# Patient Record
Sex: Female | Born: 1975 | Race: White | Hispanic: No | Marital: Married | Smoking: Never smoker
Health system: Southern US, Community
[De-identification: ages and names within clinical notes are randomized; demographics above are authoritative.]

## PROBLEM LIST (undated history)

## (undated) DIAGNOSIS — T4145XA Adverse effect of unspecified anesthetic, initial encounter: Secondary | ICD-10-CM

## (undated) DIAGNOSIS — T8859XA Other complications of anesthesia, initial encounter: Secondary | ICD-10-CM

## (undated) DIAGNOSIS — R569 Unspecified convulsions: Secondary | ICD-10-CM

## (undated) DIAGNOSIS — K219 Gastro-esophageal reflux disease without esophagitis: Secondary | ICD-10-CM

## (undated) HISTORY — PX: BREAST CYST ASPIRATION: SHX578

---

## 2000-04-05 ENCOUNTER — Other Ambulatory Visit: Admission: RE | Admit: 2000-04-05 | Discharge: 2000-04-05 | Payer: Self-pay | Admitting: Gynecology

## 2000-10-03 ENCOUNTER — Other Ambulatory Visit: Admission: RE | Admit: 2000-10-03 | Discharge: 2000-10-03 | Payer: Self-pay | Admitting: *Deleted

## 2001-09-07 ENCOUNTER — Other Ambulatory Visit: Admission: RE | Admit: 2001-09-07 | Discharge: 2001-09-07 | Payer: Self-pay | Admitting: *Deleted

## 2002-10-02 ENCOUNTER — Other Ambulatory Visit: Admission: RE | Admit: 2002-10-02 | Discharge: 2002-10-02 | Payer: Self-pay | Admitting: Obstetrics and Gynecology

## 2003-11-05 ENCOUNTER — Other Ambulatory Visit: Admission: RE | Admit: 2003-11-05 | Discharge: 2003-11-05 | Payer: Self-pay | Admitting: Obstetrics and Gynecology

## 2004-04-20 ENCOUNTER — Other Ambulatory Visit: Admission: RE | Admit: 2004-04-20 | Discharge: 2004-04-20 | Payer: Self-pay | Admitting: Gynecology

## 2004-06-19 ENCOUNTER — Encounter (INDEPENDENT_AMBULATORY_CARE_PROVIDER_SITE_OTHER): Payer: Self-pay | Admitting: Specialist

## 2004-06-19 ENCOUNTER — Ambulatory Visit (HOSPITAL_COMMUNITY): Admission: RE | Admit: 2004-06-19 | Discharge: 2004-06-19 | Payer: Self-pay | Admitting: Plastic Surgery

## 2004-06-19 ENCOUNTER — Ambulatory Visit (HOSPITAL_BASED_OUTPATIENT_CLINIC_OR_DEPARTMENT_OTHER): Admission: RE | Admit: 2004-06-19 | Discharge: 2004-06-19 | Payer: Self-pay | Admitting: Plastic Surgery

## 2004-11-14 ENCOUNTER — Inpatient Hospital Stay (HOSPITAL_COMMUNITY): Admission: AD | Admit: 2004-11-14 | Discharge: 2004-11-17 | Payer: Self-pay | Admitting: Gynecology

## 2004-11-14 ENCOUNTER — Encounter (INDEPENDENT_AMBULATORY_CARE_PROVIDER_SITE_OTHER): Payer: Self-pay | Admitting: *Deleted

## 2004-12-25 ENCOUNTER — Other Ambulatory Visit: Admission: RE | Admit: 2004-12-25 | Discharge: 2004-12-25 | Payer: Self-pay | Admitting: Gynecology

## 2006-01-18 ENCOUNTER — Other Ambulatory Visit: Admission: RE | Admit: 2006-01-18 | Discharge: 2006-01-18 | Payer: Self-pay | Admitting: Gynecology

## 2007-01-20 ENCOUNTER — Other Ambulatory Visit: Admission: RE | Admit: 2007-01-20 | Discharge: 2007-01-20 | Payer: Self-pay | Admitting: Gynecology

## 2008-01-25 ENCOUNTER — Encounter: Payer: Self-pay | Admitting: Women's Health

## 2008-01-25 ENCOUNTER — Ambulatory Visit: Payer: Self-pay | Admitting: Women's Health

## 2008-01-25 ENCOUNTER — Other Ambulatory Visit: Admission: RE | Admit: 2008-01-25 | Discharge: 2008-01-25 | Payer: Self-pay | Admitting: Gynecology

## 2008-08-12 ENCOUNTER — Ambulatory Visit: Payer: Self-pay | Admitting: Obstetrics and Gynecology

## 2008-08-14 ENCOUNTER — Encounter: Admission: RE | Admit: 2008-08-14 | Discharge: 2008-08-14 | Payer: Self-pay | Admitting: Obstetrics and Gynecology

## 2009-02-14 ENCOUNTER — Encounter: Admission: RE | Admit: 2009-02-14 | Discharge: 2009-02-14 | Payer: Self-pay | Admitting: Obstetrics & Gynecology

## 2010-05-22 NOTE — Op Note (Signed)
NAMECHERYAL, SALAS NO.:  [id number]   MEDICAL RECORD NO.:  [id number]          PATIENT TYPE:  AMB   LOCATION:  DSC                          FACILITY:  [facility]   PHYSICIAN:  Alfredia Ferguson, M.D.  DATE OF BIRTH:  July 11, 1975   DATE OF PROCEDURE:  06/19/2004  DATE OF DISCHARGE:                                 OPERATIVE REPORT   PREOPERATIVE DIAGNOSES:  1.  Biopsy-proven dysplastic nevus, left posterior flank, the 8-mm biopsy      site.  2.  Biopsy-proven dysplastic nevus, right scapular area, adjacent to a scar      with biopsy site approximately 9 mm in diameter.   POSTOPERATIVE DIAGNOSES:  1.  Biopsy-proven dysplastic nevus, left posterior flank, the 8-mm biopsy      site.  2.  Biopsy-proven dysplastic nevus, right scapular area, adjacent to a scar      with biopsy site approximately 9 mm in diameter.   OPERATION:  Elliptical excision of dysplastic nevi x2 with approximately 3-  mm margins.   SURGEON:  Alfredia Ferguson, M.D.   ANESTHESIA:  2% Xylocaine and 1:100,000 epinephrine, a total of 3.6 mL used  for both lesions.   INDICATIONS FOR SURGERY:  This a 35 year old woman who is approximately 4  months' pregnant who has biopsy-proven dysplastic nevi in her back.  She  wishes to have these removed in order to clear the margins.  The risk of the  surgery including unsightly scarring and positive margins were discussed  with the patient.  She spoke with her obstetrician regarding the use of  local anesthesia.  She was told that either lidocaine plain or lidocaine  with epinephrine was fine.  I reduced the volume to as small a volume as I  could for excision.   DESCRIPTION OF SURGERY:  Skin marks were placed in an elliptical fashion  around each of the 2 lesions with approximately 3-mm margins.  Local  anesthesia was infiltrated and the areas were prepped with Betadine and  draped with sterile drapes.  After waiting 10 minutes, attention was  directed to the  left posterior flank lesion which was excised in an  elliptical fashion down to the level of the subcutaneous tissue.  Specimen  was submitted for pathology.  Wound edges were undermined for a distance of  several millimeters in all directions.  The wound was closed with  interrupted 4-0 Monocryl sutures for the dermis followed by Steri-Strips for  the skin edges.  Attention was directed to the right scapular area, where an  identical procedure was performed.  Upon completion of the  closure of that site, the areas were cleansed and dried, and light dressings  were applied.  The patient tolerated the procedure well.  She had minimal  blood loss.  Light dressings were applied and the patient was discharged to  home in satisfactory addition.       WBB/MEDQ  D:  06/19/2004  T:  06/19/2004  Job:  [id number]   cc:   Hope M. Danella Deis, M.D.  [address]  [place] [address]  Fax: [phone]

## 2010-05-22 NOTE — H&P (Signed)
NAME:  [name], [name] NO.:  [id number]   MEDICAL RECORD NO.:  [id number]          PATIENT TYPE:  MAT   LOCATION:  MATC                          [facility]ILITY:  [facility]   PHYSICIAN:  Juan H. Lily Peer, M.D.DATE OF BIRTH:  May 26, 1975   DATE OF ADMISSION:  11/14/2004  DATE OF DISCHARGE:                                HISTORY & PHYSICAL   CHIEF COMPLAINT:  1.  Term intrauterine pregnancy at 40-1/[redacted] week gestation.  2.  Active labor.  3.  Advanced cervical dilatation.   HISTORY:  The patient is a 35 year old gravida 2, para 0, AB 1 with  estimated date of confinement of November 13, 2004. The patient last evening  was complaining of irregular contractions and presented to the emergency  room this morning complaining of irregular contractions. Her cervix went  from 4 to 6 cm ____________ maternity admission with a reassuring fetal  heart tracing, contractions every 2 to 3 minutes apart. Vital signs were  stable. She was afebrile. Prenatal course is essentially benign with the  exception that she was followed by a dermatologist for some dysplastic moles  that were removed from her back and her lower extremity.   PAST MEDICAL HISTORY:  The patient with one spontaneous AB in 2005. She has  lactose intolerance, dysplastic mole on her back and lower extremity.   REVIEW OF SYSTEMS:  Please see [place] form.   PHYSICAL EXAMINATION:  VITAL SIGNS:  Stable, afebrile.  HEENT:  Unremarkable.  NECK:  Supple. Trachea midline. No carotid bruits. No thyromegaly.  LUNGS:  Clear to auscultation without rhonchi or wheezes.  HEART:  Regular rate and rhythm. No murmurs or gallops.  BREASTS:  Not done.  ABDOMEN:  Gravid uterus. Vertex presentation by [facility] maneuver.  PELVIC:  6 to 7 cm, bulging membranes, -3 station.  EXTREMITIES:  DTRs 1+. Negative clonus.   PRENATAL LABORATORY DATA:  O positive blood type. Negative antibiotic  screen. VDRL was nonreactive. Rubella immune. Hepatitis B  surface antigen  and HIV were negative. Alpha fetoprotein was normal. Diabetes screen was  normal. The patient was GBS on culture was negative.   ASSESSMENT:  A 35 year old gravida 2, para 0, AB 1 at 40-1/[redacted] weeks gestation  with a planned cervical dilatation in active labor. Cervix 6 to 7 cm,  bulging membranes, -2 station, reassuring fetal heart rate tracing,  contraction every 2 to 3 minutes apart. Vital signs stable, afebrile. Group  B strep culture negative.   PLAN:  Admit to labor and delivery. Patient requesting epidural. Once  epidural is on board, we will proceed with rupture of membranes and augment  labor with Pitocin if indicated.      Juan H. Lily Peer, M.D.  Electronically Signed     JHF/MEDQ  D:  11/14/2004  T:  11/14/2004  Job:  [id number]

## 2010-05-22 NOTE — Op Note (Signed)
[name], [name]               ACCOUNT NO.:  [id number]   MEDICAL RECORD NO.:  [id number]          PATIENT TYPE:  INP   LOCATION:  9130                          [facility]ACILITY:  [facility]   PHYSICIAN:  Juan H. Lily Peer, M.D.DATE OF BIRTH:  09-Dec-1975   DATE OF PROCEDURE:  DATE OF DISCHARGE:                                 OPERATIVE REPORT   SURGEON:  Juan H. Lily Peer, M.D.   INDICATIONS:  A 35 year old, gravida 1, para 0, term with labor, advanced  cervical dilatation.  Underwent artificial rupture of membranes with  meconium amniotic fluid and was noted to be in the frank breech  presentation, confirmed by beside ultrasound.  Reassuring fetal heart rate  tracing.  Spontaneous contractions.   PREOPERATIVE DIAGNOSES:  1.  Term intrauterine pregnancy.  2.  Active labor.  3.  Advanced cervical dilatation.  4.  Meconium stained amniotic fluid.  5.  [name] breech presentation.   POSTOPERATIVE DIAGNOSES:  1.  Term intrauterine pregnancy.  2.  Active labor.  3.  Advanced cervical dilatation.  4.  Meconium stained amniotic fluid.  5.  [name] breech presentation.   ANESTHESIA:  Epidural.   OPERATION/PROCEDURE:  Primary lower uterine segment transverse cesarean  section.   FINDINGS:  Viable female infant, Apgars 9 and 9, arterial cord PH of 7.26,  weight 6 pounds 5 ounces.  Meconium-stained amniotic fluid. Normal maternal  pelvic anatomy.   DESCRIPTION OF PROCEDURE:  After the patient was adequately counseled, she  was taken to the operating room where she had been redosed through the  epidural catheter.  The abdomen was prepped and draped in the usual sterile  fashion.  A Pfannenstiel skin incision was made 2 cm above the symphysis  pubis.  Foley catheter was present in an effort to monitor urinary output.  A Pfannenstiel skin incision was made 2 cm above the symphysis pubis.  Incision was carried through the skin down to the subcutaneous tissue, down  to the rectus fascia, whereby a  midline nick was made.  The fascia was  incised in the transverse fashion.  The midline raphe was entered.  The  peritoneal cavity was entered cautiously.  The bladder flap was established  and the lower uterine segment was incised in a transverse fashion.  The  amniotic fluid was found meconium-stained.  The buttocks were delivered and  the rest of the fetus was delivered in the frank breech presentation.  The  nasopharyngeal airway was suctioned with DeLee suction for approximately 2  mL of light-colored meconium fluid.  The cord was doubly clamped and  excised.  The newborn gave immediate cry, was shown to the parents and  passed off to the neonatologists who were in attendance who gave the above-  mentioned parameters.  After cord blood was obtained, the placenta was  delivered from the intrauterine cavity and submitted for histological  evaluation.  The patient received a gram of Ancef and Pitocin was started as  a uterotonic agent.  The intrauterine cavity was swept clear of remaining  products of conception and copiously irrigated with normal saline solution.  The  uterine cavity was closed in a two layer fashion.  The first with a  locking stitch manner and the second one in an imbricating manner with 0  Vicryl suture.  Inspection of the pelvic cavity demonstrated normal uterus,  tubes and ovaries.  The uterus was placed back in the pelvic cavity and the  pelvic cavity was copiously irrigated with normal saline solution.  After  ascertaining adequate hemostasis, closure was started.  The visceral  peritoneum was not reapproximated but the rectus fascia was closed with a  running stitch of 0 Vicryl suture.  The subcutaneous bleeders were Bovie  cauterized and skin was reapproximated with skin clips followed by placement  of Xeroform gauze and 4 x 4 dressing.  The patient was transferred to the  recovery room with stable vital signs.  Blood loss for the procedure was 300  ml.  Urine  output was 300 mL and  IV fluids consisted of 1000 mL of  lactated Ringer's.   The newborn had sustained a small cut on the left thigh at time the incision  was being made into the uterus, but very small in nature.  Was shown to the  parents.  Questions were answered and will follow accordingly.      Juan H. Lily Peer, M.D.  Electronically Signed     JHF/MEDQ  D:  11/14/2004  T:  11/15/2004  Job:  [id number]

## 2010-05-22 NOTE — Discharge Summary (Signed)
[name], [name]               ACCOUNT NO.:  [id number]   MEDICAL RECORD NO.:  [id number]          PATIENT TYPE:  INP   LOCATION:  9130                          [facility]ILITY[facility]   PHYSICIAN:  Ivor Costa. Farrel Gobble, M.D. DATE OF BIRTH:  September 28, 1975   DATE OF ADMISSION:  11/14/2004  DATE OF DISCHARGE:  11/17/2004                                 DISCHARGE SUMMARY   PRINCIPAL DIAGNOSES:  1.  Term pregnancy.  2.  Breech in labor.   PRINCIPAL PROCEDURE:  Primary Cesarean section.   HOSPITAL COURSE:  The patient was admitted on November 14, 2004 in active  labor at which point as she progressed she was noted to be in the breech  presentation and therefore was transferred to the operating room and  underwent a primary Cesarean section by Dr. Lily Peer for delivery of a  viable female with birth weight of 6 pounds, 5 ounces, Apgar's  of 9 and 9.  The patient was then transferred to the postpartum floor in due fashion.  Her postpartum course was unremarkable.  At the time of discharge on  postoperative day #3 the patient was ambulating without difficulty.  She was  breastfeeding and she was without any complaints.  She remained afebrile and  her vital signs were stable throughout.  Her abdomen was soft, nontender.  Her incision was clean, dry and intact with staples.  Her uterus was firm  below the umbilicus.  Her extremities were nontender.   LABORATORY DATA:  Her white count was 11.9, hemoglobin 12, hematocrit was  34.8, platelet count were 173,000.   DISPOSITION:  The patient was discharged home in stable condition with  instructions to use over the counter Motrin as well as a prescription for  Percocet 5/325 mg one to two q.6h. PRN for pain. She is instructed to follow  up in our office in six weeks.      Ivor Costa. Farrel Gobble, M.D.  Electronically Signed     THL/MEDQ  D:  11/17/2004  T:  11/17/2004  Job:  [id number]

## 2010-06-17 LAB — HIV ANTIBODY (ROUTINE TESTING W REFLEX): HIV: NONREACTIVE

## 2010-06-17 LAB — ABO/RH: RH Type: POSITIVE

## 2010-06-17 LAB — ANTIBODY SCREEN: Antibody Screen: NEGATIVE

## 2010-06-26 ENCOUNTER — Other Ambulatory Visit: Payer: Self-pay | Admitting: Obstetrics & Gynecology

## 2011-01-08 ENCOUNTER — Encounter (HOSPITAL_COMMUNITY): Payer: Self-pay | Admitting: Pharmacist

## 2011-01-11 ENCOUNTER — Other Ambulatory Visit: Payer: Self-pay | Admitting: Obstetrics & Gynecology

## 2011-01-12 ENCOUNTER — Encounter (HOSPITAL_COMMUNITY): Payer: Self-pay

## 2011-01-13 ENCOUNTER — Encounter (HOSPITAL_COMMUNITY)
Admission: RE | Admit: 2011-01-13 | Discharge: 2011-01-13 | Disposition: A | Discharge status: Home / Self Care | Payer: BC Managed Care – PPO | Source: Ambulatory Visit | Attending: Obstetrics & Gynecology | Admitting: Obstetrics & Gynecology

## 2011-01-13 ENCOUNTER — Encounter (HOSPITAL_COMMUNITY): Payer: Self-pay

## 2011-01-13 HISTORY — DX: Adverse effect of unspecified anesthetic, initial encounter: T41.45XA

## 2011-01-13 HISTORY — DX: Gastro-esophageal reflux disease without esophagitis: K21.9

## 2011-01-13 HISTORY — DX: Other complications of anesthesia, initial encounter: T88.59XA

## 2011-01-13 HISTORY — DX: Unspecified convulsions: R56.9

## 2011-01-13 LAB — CBC
HCT: 40.6 % (ref 36.0–46.0)
MCV: 97.1 fL (ref 78.0–100.0)
Platelets: 184 10*3/uL (ref 150–400)
RDW: 13.3 % (ref 11.5–15.5)
WBC: 10.1 10*3/uL (ref 4.0–10.5)

## 2011-01-13 NOTE — Patient Instructions (Addendum)
YOUR PROCEDURE IS SCHEDULED ON:Sat 01/16/11  ENTER THROUGH THE MAIN ENTRANCE OF [facility] AT:0730  USE DESK PHONE AND DIAL [phone] TO INFORM us OF YOUR ARRIVAL  CALL [phone] IF YOU HAVE ANY QUESTIONS OR PROBLEMS PRIOR TO YOUR ARRIVAL.  REMEMBER: DO NOT EAT OR DRINK AFTER MIDNIGHT :Friday  SPECIAL INSTRUCTIONS:   YOU MAY BRUSH YOUR TEETH THE MORNING OF SURGERY   TAKE THESE MEDICINES THE DAY OF SURGERY WITH SIP OF WATER:none   DO NOT WEAR JEWELRY, EYE MAKEUP, LIPSTICK OR DARK FINGERNAIL POLISH DO NOT WEAR LOTIONS DO NOT SHAVE FOR 48 HOURS PRIOR TO SURGERY  YOU WILL NOT BE ALLOWED TO DRIVE YOURSELF HOME.  NAME OF DRIVER:Jim Mines- [phone]    Your procedure is scheduled on:01/16/11  Enter through the Main Entrance at :0730 am Pick up desk phone and dial [phone] and inform us of your arrival.  Please call [phone] if you have any problems the morning of surgery.  Remember: Do not eat or drink after midnight:Friday Take these meds the morning of surgery with a sip of water:none DO NOT wear jewelry, make-up, lotion, or dark fingernail polish. Do not shave for 48 hours prior to surgery.  If you are to be admitted after surgery, leave suitcase in car until your room has been assigned. Patients discharged on the day of surgery will not be allowed to drive home.   Remember to use your Hibiclens as instructed.

## 2011-01-13 NOTE — Pre-Procedure Instructions (Signed)
Patient given instructions on paper after glitch in system prevented printing EPIC orders.

## 2011-01-16 ENCOUNTER — Inpatient Hospital Stay (HOSPITAL_COMMUNITY): Payer: BC Managed Care – PPO | Admitting: Anesthesiology

## 2011-01-16 ENCOUNTER — Encounter (HOSPITAL_COMMUNITY)
Admission: RE | Disposition: A | Discharge status: Home / Self Care | Payer: Self-pay | Source: Ambulatory Visit | Attending: Obstetrics & Gynecology

## 2011-01-16 ENCOUNTER — Inpatient Hospital Stay (HOSPITAL_COMMUNITY)
Admission: RE | Admit: 2011-01-16 | Discharge: 2011-01-18 | DRG: 371 | Disposition: A | Discharge status: Home / Self Care | Payer: BC Managed Care – PPO | Source: Ambulatory Visit | Attending: Obstetrics & Gynecology | Admitting: Obstetrics & Gynecology

## 2011-01-16 ENCOUNTER — Encounter (HOSPITAL_COMMUNITY): Payer: Self-pay | Admitting: *Deleted

## 2011-01-16 ENCOUNTER — Encounter (HOSPITAL_COMMUNITY): Payer: Self-pay | Admitting: Emergency Medicine

## 2011-01-16 ENCOUNTER — Encounter (HOSPITAL_COMMUNITY): Payer: Self-pay | Admitting: Anesthesiology

## 2011-01-16 DIAGNOSIS — O321XX0 Maternal care for breech presentation, not applicable or unspecified: Secondary | ICD-10-CM | POA: Diagnosis present

## 2011-01-16 DIAGNOSIS — Z01812 Encounter for preprocedural laboratory examination: Secondary | ICD-10-CM

## 2011-01-16 DIAGNOSIS — O34219 Maternal care for unspecified type scar from previous cesarean delivery: Principal | ICD-10-CM | POA: Diagnosis present

## 2011-01-16 DIAGNOSIS — Z01818 Encounter for other preprocedural examination: Secondary | ICD-10-CM

## 2011-01-16 LAB — ABO/RH: ABO/RH(D): O POS

## 2011-01-16 SURGERY — Surgical Case
Anesthesia: Spinal

## 2011-01-16 MED ORDER — WITCH HAZEL-GLYCERIN EX PADS: 1.0000 "application " | MEDICATED_PAD | CUTANEOUS | Status: DC | PRN

## 2011-01-16 MED ORDER — ACETAMINOPHEN 10 MG/ML IV SOLN: 1000.0000 mg | Freq: Four times a day (QID) | INTRAVENOUS | Status: AC | PRN

## 2011-01-16 MED ORDER — ACETAMINOPHEN 325 MG PO TABS: 325.0000 mg | ORAL_TABLET | ORAL | Status: DC | PRN

## 2011-01-16 MED ORDER — OXYTOCIN 20 UNITS IN LACTATED RINGERS INFUSION - SIMPLE
INTRAVENOUS | Status: AC
  Administered 2011-01-16: 125 mL/h via INTRAVENOUS
  Filled 2011-01-16: qty 1000

## 2011-01-16 MED ORDER — OXYTOCIN 20 UNITS IN LACTATED RINGERS INFUSION - SIMPLE
INTRAVENOUS | Status: DC | PRN
  Administered 2011-01-16: 20 [IU] via INTRAVENOUS

## 2011-01-16 MED ORDER — BUPIVACAINE IN DEXTROSE 0.75-8.25 % IT SOLN
INTRATHECAL | Status: DC | PRN
  Administered 2011-01-16: 12 mg via INTRATHECAL

## 2011-01-16 MED ORDER — ZOLPIDEM TARTRATE 5 MG PO TABS: 5.0000 mg | ORAL_TABLET | Freq: Every evening | ORAL | Status: DC | PRN

## 2011-01-16 MED ORDER — KETOROLAC TROMETHAMINE 30 MG/ML IJ SOLN: 30.0000 mg | Freq: Four times a day (QID) | INTRAMUSCULAR | Status: AC | PRN

## 2011-01-16 MED ORDER — EPHEDRINE SULFATE 50 MG/ML IJ SOLN
INTRAMUSCULAR | Status: DC | PRN
  Administered 2011-01-16 (×3): 10 mg via INTRAVENOUS

## 2011-01-16 MED ORDER — LACTATED RINGERS IV SOLN
INTRAVENOUS | Status: DC | PRN
  Administered 2011-01-16 (×3): via INTRAVENOUS

## 2011-01-16 MED ORDER — PRENATAL MULTIVITAMIN CH
1.0000 | ORAL_TABLET | Freq: Every day | ORAL | Status: DC
  Filled 2011-01-16 (×2): qty 1

## 2011-01-16 MED ORDER — PROMETHAZINE HCL 25 MG/ML IJ SOLN: 6.2500 mg | INTRAMUSCULAR | Status: DC | PRN

## 2011-01-16 MED ORDER — DIPHENHYDRAMINE HCL 50 MG/ML IJ SOLN: 12.5000 mg | INTRAMUSCULAR | Status: DC | PRN

## 2011-01-16 MED ORDER — OXYCODONE-ACETAMINOPHEN 5-325 MG PO TABS: 1.0000 | ORAL_TABLET | ORAL | Status: DC | PRN

## 2011-01-16 MED ORDER — LORATADINE 10 MG PO TABS
10.0000 mg | ORAL_TABLET | Freq: Every day | ORAL | Status: DC | PRN
  Filled 2011-01-16: qty 1

## 2011-01-16 MED ORDER — SCOPOLAMINE 1 MG/3DAYS TD PT72
1.0000 | MEDICATED_PATCH | Freq: Once | TRANSDERMAL | Status: DC
  Administered 2011-01-16: 1.5 mg via TRANSDERMAL

## 2011-01-16 MED ORDER — IBUPROFEN 600 MG PO TABS
600.0000 mg | ORAL_TABLET | Freq: Four times a day (QID) | ORAL | Status: DC
  Administered 2011-01-16 – 2011-01-18 (×7): 600 mg via ORAL
  Filled 2011-01-16 (×7): qty 1

## 2011-01-16 MED ORDER — KETOROLAC TROMETHAMINE 30 MG/ML IJ SOLN
INTRAMUSCULAR | Status: AC
  Administered 2011-01-16: 30 mg via INTRAVENOUS
  Filled 2011-01-16: qty 1

## 2011-01-16 MED ORDER — DIPHENHYDRAMINE HCL 25 MG PO CAPS: 25.0000 mg | ORAL_CAPSULE | Freq: Four times a day (QID) | ORAL | Status: DC | PRN

## 2011-01-16 MED ORDER — ONDANSETRON HCL 4 MG/2ML IJ SOLN
INTRAMUSCULAR | Status: DC | PRN
  Administered 2011-01-16: 4 mg via INTRAVENOUS

## 2011-01-16 MED ORDER — DIBUCAINE 1 % RE OINT: 1.0000 "application " | TOPICAL_OINTMENT | RECTAL | Status: DC | PRN

## 2011-01-16 MED ORDER — SIMETHICONE 80 MG PO CHEW: 80.0000 mg | CHEWABLE_TABLET | ORAL | Status: DC | PRN

## 2011-01-16 MED ORDER — LACTATED RINGERS IV BOLUS (SEPSIS)
1000.0000 mL | Freq: Once | INTRAVENOUS | Status: AC
  Administered 2011-01-16: 1000 mL via INTRAVENOUS

## 2011-01-16 MED ORDER — TETANUS-DIPHTH-ACELL PERTUSSIS 5-2.5-18.5 LF-MCG/0.5 IM SUSP
0.5000 mL | Freq: Once | INTRAMUSCULAR | Status: AC
  Administered 2011-01-17: 0.5 mL via INTRAMUSCULAR
  Filled 2011-01-16: qty 0.5

## 2011-01-16 MED ORDER — FENTANYL CITRATE 0.05 MG/ML IJ SOLN: 25.0000 ug | INTRAMUSCULAR | Status: DC | PRN

## 2011-01-16 MED ORDER — SIMETHICONE 80 MG PO CHEW
80.0000 mg | CHEWABLE_TABLET | Freq: Three times a day (TID) | ORAL | Status: DC
  Administered 2011-01-16 – 2011-01-18 (×8): 80 mg via ORAL

## 2011-01-16 MED ORDER — SODIUM CHLORIDE 0.9 % IV SOLN: 1.0000 ug/kg/h | INTRAVENOUS | Status: DC | PRN

## 2011-01-16 MED ORDER — GUAIFENESIN ER 600 MG PO TB12
600.0000 mg | ORAL_TABLET | Freq: Two times a day (BID) | ORAL | Status: DC | PRN
  Filled 2011-01-16: qty 1

## 2011-01-16 MED ORDER — METOCLOPRAMIDE HCL 5 MG/ML IJ SOLN: 10.0000 mg | Freq: Three times a day (TID) | INTRAMUSCULAR | Status: DC | PRN

## 2011-01-16 MED ORDER — LANOLIN HYDROUS EX OINT: 1.0000 "application " | TOPICAL_OINTMENT | CUTANEOUS | Status: DC | PRN

## 2011-01-16 MED ORDER — FAMOTIDINE IN NACL 20-0.9 MG/50ML-% IV SOLN
20.0000 mg | Freq: Once | INTRAVENOUS | Status: AC
  Administered 2011-01-16: 20 mg via INTRAVENOUS

## 2011-01-16 MED ORDER — NALBUPHINE HCL 10 MG/ML IJ SOLN: 5.0000 mg | INTRAMUSCULAR | Status: DC | PRN

## 2011-01-16 MED ORDER — MEPERIDINE HCL 25 MG/ML IJ SOLN: 6.2500 mg | INTRAMUSCULAR | Status: DC | PRN

## 2011-01-16 MED ORDER — MORPHINE SULFATE (PF) 0.5 MG/ML IJ SOLN
INTRAMUSCULAR | Status: DC | PRN
  Administered 2011-01-16: .1 mg via INTRATHECAL

## 2011-01-16 MED ORDER — SENNOSIDES-DOCUSATE SODIUM 8.6-50 MG PO TABS
2.0000 | ORAL_TABLET | Freq: Every day | ORAL | Status: DC
  Administered 2011-01-16 – 2011-01-17 (×2): 2 via ORAL

## 2011-01-16 MED ORDER — CITRIC ACID-SODIUM CITRATE 334-500 MG/5ML PO SOLN
ORAL | Status: AC
  Administered 2011-01-16: 30 mL via ORAL
  Filled 2011-01-16: qty 15

## 2011-01-16 MED ORDER — CEFAZOLIN SODIUM 1-5 GM-% IV SOLN
1.0000 g | INTRAVENOUS | Status: AC
  Administered 2011-01-16: 1 g via INTRAVENOUS
  Filled 2011-01-16: qty 50

## 2011-01-16 MED ORDER — LACTATED RINGERS IV SOLN
INTRAVENOUS | Status: DC
  Administered 2011-01-16: 16:00:00 via INTRAVENOUS

## 2011-01-16 MED ORDER — FAMOTIDINE IN NACL 20-0.9 MG/50ML-% IV SOLN
INTRAVENOUS | Status: AC
  Administered 2011-01-16: 20 mg via INTRAVENOUS
  Filled 2011-01-16: qty 50

## 2011-01-16 MED ORDER — SCOPOLAMINE 1 MG/3DAYS TD PT72
MEDICATED_PATCH | TRANSDERMAL | Status: AC
  Administered 2011-01-16: 1.5 mg via TRANSDERMAL
  Filled 2011-01-16: qty 1

## 2011-01-16 MED ORDER — DIPHENHYDRAMINE HCL 50 MG/ML IJ SOLN: 25.0000 mg | INTRAMUSCULAR | Status: DC | PRN

## 2011-01-16 MED ORDER — MENTHOL 3 MG MT LOZG: 1.0000 | LOZENGE | OROMUCOSAL | Status: DC | PRN

## 2011-01-16 MED ORDER — DIPHENHYDRAMINE HCL 25 MG PO CAPS: 25.0000 mg | ORAL_CAPSULE | ORAL | Status: DC | PRN

## 2011-01-16 MED ORDER — NALOXONE HCL 0.4 MG/ML IJ SOLN: 0.4000 mg | INTRAMUSCULAR | Status: DC | PRN

## 2011-01-16 MED ORDER — IBUPROFEN 600 MG PO TABS: 600.0000 mg | ORAL_TABLET | Freq: Four times a day (QID) | ORAL | Status: DC | PRN

## 2011-01-16 MED ORDER — ONDANSETRON HCL 4 MG/2ML IJ SOLN: 4.0000 mg | INTRAMUSCULAR | Status: DC | PRN

## 2011-01-16 MED ORDER — SODIUM CHLORIDE 0.9 % IJ SOLN: 3.0000 mL | INTRAMUSCULAR | Status: DC | PRN

## 2011-01-16 MED ORDER — ONDANSETRON HCL 4 MG PO TABS: 4.0000 mg | ORAL_TABLET | ORAL | Status: DC | PRN

## 2011-01-16 MED ORDER — FENTANYL CITRATE 0.05 MG/ML IJ SOLN
INTRAMUSCULAR | Status: DC | PRN
  Administered 2011-01-16: 25 ug via INTRATHECAL

## 2011-01-16 MED ORDER — CITRIC ACID-SODIUM CITRATE 334-500 MG/5ML PO SOLN
30.0000 mL | Freq: Once | ORAL | Status: AC
  Administered 2011-01-16: 30 mL via ORAL

## 2011-01-16 MED ORDER — ONDANSETRON HCL 4 MG/2ML IJ SOLN: 4.0000 mg | Freq: Three times a day (TID) | INTRAMUSCULAR | Status: DC | PRN

## 2011-01-16 MED ORDER — KETOROLAC TROMETHAMINE 30 MG/ML IJ SOLN
30.0000 mg | Freq: Four times a day (QID) | INTRAMUSCULAR | Status: AC | PRN
  Administered 2011-01-16: 30 mg via INTRAVENOUS

## 2011-01-16 MED ORDER — PHENYLEPHRINE HCL 10 MG/ML IJ SOLN
INTRAMUSCULAR | Status: DC | PRN
  Administered 2011-01-16: 40 ug via INTRAVENOUS

## 2011-01-16 MED ORDER — OXYTOCIN 20 UNITS IN LACTATED RINGERS INFUSION - SIMPLE
125.0000 mL/h | INTRAVENOUS | Status: AC
  Administered 2011-01-16: 125 mL/h via INTRAVENOUS

## 2011-01-16 SURGICAL SUPPLY — 41 items
APL SKNCLS STERI-STRIP NONHPOA (GAUZE/BANDAGES/DRESSINGS)
BENZOIN TINCTURE PRP APPL 2/3 (GAUZE/BANDAGES/DRESSINGS) IMPLANT
CHLORAPREP W/TINT 26ML (MISCELLANEOUS) ×2 IMPLANT
CLOTH BEACON ORANGE TIMEOUT ST (SAFETY) ×2 IMPLANT
CONTAINER PREFILL 10% NBF 15ML (MISCELLANEOUS) IMPLANT
DRESSING TELFA 8X3 (GAUZE/BANDAGES/DRESSINGS) ×2 IMPLANT
DRSG COVADERM 4X10 (GAUZE/BANDAGES/DRESSINGS) ×1 IMPLANT
ELECT REM PT RETURN 9FT ADLT (ELECTROSURGICAL) ×2
ELECTRODE REM PT RTRN 9FT ADLT (ELECTROSURGICAL) ×1 IMPLANT
EXTRACTOR VACUUM KIWI (MISCELLANEOUS) IMPLANT
EXTRACTOR VACUUM M CUP 4 TUBE (SUCTIONS) IMPLANT
GAUZE SPONGE 4X4 12PLY STRL LF (GAUZE/BANDAGES/DRESSINGS) ×4 IMPLANT
GLOVE BIO SURGEON STRL SZ7 (GLOVE) ×2 IMPLANT
GLOVE BIOGEL PI IND STRL 7.0 (GLOVE) ×2 IMPLANT
GLOVE BIOGEL PI INDICATOR 7.0 (GLOVE) ×2
GLOVE SURG SS PI 7.5 STRL IVOR (GLOVE) ×2 IMPLANT
GOWN PREVENTION PLUS LG XLONG (DISPOSABLE) ×6 IMPLANT
KIT ABG SYR 3ML LUER SLIP (SYRINGE) IMPLANT
NDL HYPO 25X5/8 SAFETYGLIDE (NEEDLE) IMPLANT
NEEDLE HYPO 25X5/8 SAFETYGLIDE (NEEDLE) IMPLANT
NS IRRIG 1000ML POUR BTL (IV SOLUTION) ×2 IMPLANT
PACK C SECTION WH (CUSTOM PROCEDURE TRAY) ×2 IMPLANT
PAD ABD 7.5X8 STRL (GAUZE/BANDAGES/DRESSINGS) ×2 IMPLANT
RTRCTR C-SECT PINK 25CM LRG (MISCELLANEOUS) IMPLANT
SLEEVE SCD COMPRESS KNEE MED (MISCELLANEOUS) IMPLANT
STAPLER VISISTAT 35W (STAPLE) IMPLANT
STRIP CLOSURE SKIN 1/4X4 (GAUZE/BANDAGES/DRESSINGS) IMPLANT
SUT MNCRL 0 VIOLET CTX 36 (SUTURE) ×3 IMPLANT
SUT MONOCRYL 0 CTX 36 (SUTURE) ×3
SUT PLAIN 0 NONE (SUTURE) IMPLANT
SUT PLAIN 2 0 (SUTURE)
SUT PLAIN ABS 2-0 CT1 27XMFL (SUTURE) IMPLANT
SUT VIC AB 0 CT1 27 (SUTURE) ×4
SUT VIC AB 0 CT1 27XBRD ANBCTR (SUTURE) ×2 IMPLANT
SUT VIC AB 2-0 CT1 27 (SUTURE) ×4
SUT VIC AB 2-0 CT1 TAPERPNT 27 (SUTURE) ×2 IMPLANT
SUT VIC AB 4-0 KS 27 (SUTURE) IMPLANT
SUT VICRYL 0 TIES 12 18 (SUTURE) IMPLANT
TOWEL OR 17X24 6PK STRL BLUE (TOWEL DISPOSABLE) ×4 IMPLANT
TRAY FOLEY CATH 14FR (SET/KITS/TRAYS/PACK) IMPLANT
WATER STERILE IRR 1000ML POUR (IV SOLUTION) ×2 IMPLANT

## 2011-01-16 NOTE — Anesthesia Postprocedure Evaluation (Signed)
Anesthesia Post Note  Patient: [name]  Procedure(s) Performed:  CESAREAN SECTION - Repeat  Anesthesia type: Spinal  Patient location: PACU  Post pain: Pain level controlled  Post assessment: Post-op Vital signs reviewed  Last Vitals:  Filed Vitals:   01/16/11 1100  BP: 124/64  Pulse:   Temp:   Resp: 16    Post vital signs: Reviewed  Level of consciousness: awake  Complications: No apparent anesthesia complications

## 2011-01-16 NOTE — Progress Notes (Signed)
Pt here for scheduled c-section for breech presentation. No complaints

## 2011-01-16 NOTE — Anesthesia Preprocedure Evaluation (Signed)
Anesthesia Evaluation  Patient identified by MRN, date of birth, ID band Patient awake    Reviewed: Allergy & Precautions, H&P , NPO status , Patient's Chart, lab work & pertinent test results  Airway Mallampati: II      Dental No notable dental hx.    Pulmonary neg pulmonary ROS,  clear to auscultation  Pulmonary exam normal       Cardiovascular Exercise Tolerance: Good neg cardio ROS regular Normal    Neuro/Psych Seizures -,  Negative Neurological ROS  Negative Psych ROS   GI/Hepatic negative GI ROS, Neg liver ROS, GERD-  ,  Endo/Other  Negative Endocrine ROS  Renal/GU negative Renal ROS  Genitourinary negative   Musculoskeletal   Abdominal Normal abdominal exam  (+)   Peds  Hematology negative hematology ROS (+)   Anesthesia Other Findings   Reproductive/Obstetrics (+) Pregnancy                           Anesthesia Physical Anesthesia Plan  ASA: II  Anesthesia Plan: Spinal   Post-op Pain Management:    Induction:   Airway Management Planned:   Additional Equipment:   Intra-op Plan:   Post-operative Plan:   Informed Consent: I have reviewed the patients History and Physical, chart, labs and discussed the procedure including the risks, benefits and alternatives for the proposed anesthesia with the patient or authorized representative who has indicated his/her understanding and acceptance.     Plan Discussed with: Anesthesiologist, CRNA and Surgeon  Anesthesia Plan Comments:         Anesthesia Quick Evaluation

## 2011-01-16 NOTE — H&P (Signed)
[name] is a 36 y.o. female presenting for repeat elective c/section for persistent breech, declined external version. 39.2/7 wks, uncomplicated pregnancy. All labs nl. Sono nl, female infant. No pain/UCs. Good FMs, no leaking or blding.   [facility] from 8 wks.   History OB History    Grav Para Term Preterm Abortions TAB SAB Ect Mult Living   4 1   2  2   1      Past Medical History  Diagnosis Date  . Seizures     childhood febrile seizure  . GERD (gastroesophageal reflux disease)     preg related  . Complication of anesthesia     was drowsy during delivery after epidural   Past Surgical History  Procedure Date  . Cesarean section 2006   Family History: family history is not on file. Social History:  reports that she has never smoked. She does not have any smokeless tobacco history on file. She reports that she does not drink alcohol or use illicit drugs.  Review of Systems  Constitutional: Negative for fever.  Eyes: Negative for blurred vision.  Gastrointestinal: Negative for abdominal pain.  Genitourinary: Negative for dysuria.  Neurological: Negative for headaches.   Exam Physical Exam   A&O x 3, no acute distress. Pleasant HEENT neg, no thyromegaly Lungs CTA bilat CV RRR, S1S2 normal Abdo soft, non tender, non acute, gravid Extr no edema/ tenderness Pelvic deferred FHT  130s. Toco  none  Prenatal labs: ABO, Rh: O/Positive/-- (06/13 0000) Antibody: Negative (06/13 0000) Rubella: Immune (06/13 0000) RPR: NON REACTIVE (01/09 1625)  HBsAg: Negative (06/13 0000)  HIV: Non-reactive (06/13 0000)  GBS: Positive (12/18 0000)   Assessment/Plan: 39.2/7 wks, for repeat elective c/section for breech.  Risks/complications reviewed, does not want TL.      Makylah Bossard R 01/16/2011, 8:29 AM

## 2011-01-16 NOTE — Op Note (Signed)
Repeat Low Transverse Cesarean Section Procedure Note 01/16/2011 [name]  Indications: 39.2/7 wks, prior cesarean delivery (for breech), now persistent breech and declined version, desired elective repeat cesarean section. Permanent sterilization not desired.    Pre-operative Diagnosis: Term, Breech, Previous Cesarean Section.   Post-operative Diagnosis: Same   Surgeon: Robley Fries, MD  Assistants: Marlinda Mike, CNM  Anesthesia: Spinal  Procedure Details:  The patient was seen in the Holding Room. The risks, benefits, complications, treatment options, and expected outcomes were discussed with the patient. The patient concurred with the proposed plan, giving informed consent. identified as [name] and the procedure verified as C-Section Delivery. She was brought to the operating room with IV running.  A Time Out was held and the above information confirmed.  After induction of Spinal anesthesia, the patient was draped and prepped in the usual sterile manner and foley was placed. She received 1 gm Ancef pre-op. A Pfannenstiel incision was made and carried down through the subcutaneous tissue to the fascia. Fascial incision was made in the midline and peritoneal entry was noted. Palpation through that ruled out dense adhesions underneath. The incision was extended transversely. The fascia was separated from the underlying rectus muscles that were very adhesed to the fascia and dissection performed superiorly and inferiorly. Peritoneum was adherent to muscles, was extended superiorly and laterally without cutting muscles.  The utero-vesical peritoneal reflection was incised transversely and the bladder flap was bluntly freed from the lower uterine segment. A low transverse uterine incision was made. Amniotomy revealed light meconium.  Baby was in complete Breech position. With gentle maneuvers baby was delivered via complete breech extraction, noted to have nuchal arms and tight  cord around neck and body. Nuchal cord was clamped at the head delivery since it was tight. Baby delivered at 9.43 am, weight 6 lb 5 oz.  with Apgar scores of 9 and 10 at 1 and 5 min. Cord ph was sent, and was normal at 7.3, cord blood was obtained for evaluation. The placenta was removed manually and complete.Uterine cavity explored, normal uterus noted. Placenta normal with 3 vessel cord. The uterine outline, tubes and ovaries appeared normal.  The uterine incision was closed with running locked sutures of 0 vicryl in 2 layers.    Hemostasis was observed. Lavage was carried out until clear. Peritoneal edges were identified and closed with 2-0 vcvryl. The fascia was then reapproximated with running sutures of 0Vicryl. The subcuticular closure was performed using 2-0 Monocryl. Sterile pressure dressing placed.  Instrument, sponge, and needle counts were correct prior the abdominal closure and were correct at the conclusion of the case.   Findings: Female infant, complete breech, delivered via complete breech extraction, tight nuchal cord and body cord, clamped and cut before head delivery. Birth time 9.43 am. Apgars 9, 10 at 1, 5 min. Weigh 6 lb 5 oz.  Placenta normal. Uterus normal (no anomalies), both ovaries, tubes normal.    Estimated Blood Loss: 500 cc Total IV Fluids: 2000 cc LR Urine Output: 250 cc clear, in foley  Specimens: Cord gas and cord bllood  Complications: None  Disposition: PACU - hemodynamically stable.   Maternal Condition: stable   Baby condition / location:  nursery-stable  V.Juliene Pina, MD Signed: Surgeon(s): Robley Fries, MD

## 2011-01-16 NOTE — Anesthesia Postprocedure Evaluation (Signed)
  Anesthesia Post-op Note  Patient: [name]  Procedure(s) Performed:  CESAREAN SECTION - Repeat  Patient Location: PACU and Mother/Baby  Anesthesia Type: Spinal  Level of Consciousness: awake, alert  and oriented  Airway and Oxygen Therapy: Patient Spontanous Breathing   Post-op Assessment: Patient's Cardiovascular Status Stable and Respiratory Function Stable  Post-op Vital Signs: stable  Complications: No apparent anesthesia complications

## 2011-01-16 NOTE — Transfer of Care (Signed)
Immediate Anesthesia Transfer of Care Note  Patient: [name]  Procedure(s) Performed:  CESAREAN SECTION - Repeat  Patient Location: PACU  Anesthesia Type: Spinal  Level of Consciousness: awake, alert  and oriented  Airway & Oxygen Therapy: Patient Spontanous Breathing  Post-op Assessment: Report given to PACU RN and Post -op Vital signs reviewed and stable  Post vital signs: Reviewed and stable Filed Vitals:   01/16/11 1026  BP: 106/49  Pulse: 101  Temp: 36.3 C  Resp: 16    Complications: No apparent anesthesia complications

## 2011-01-16 NOTE — Addendum Note (Signed)
Addendum  created 01/16/11 1129 by Fanny Dance   Modules edited:Charges VN

## 2011-01-16 NOTE — Addendum Note (Signed)
Addendum  created 01/16/11 1850 by Len Blalock   Modules edited:Notes Section

## 2011-01-16 NOTE — Anesthesia Procedure Notes (Signed)
Spinal  Patient location during procedure: OR Start time: 01/16/2011 9:21 AM Staffing Anesthesiologist: Brayton Caves R Performed by: anesthesiologist  Preanesthetic Checklist Completed: patient identified, site marked, surgical consent, pre-op evaluation, timeout performed, IV checked, risks and benefits discussed and monitors and equipment checked Spinal Block Patient position: sitting Prep: DuraPrep Patient monitoring: heart rate, cardiac monitor, continuous pulse ox and blood pressure Approach: midline Location: L3-4 Injection technique: single-shot Needle Needle type: Sprotte  Needle gauge: 24 G Needle length: 9 cm Assessment Sensory level: T4 Additional Notes Patient identified.  Risk benefits discussed including failed block, incomplete pain control, headache, nerve damage, paralysis, blood pressure changes, nausea, vomiting, reactions to medication both toxic or allergic, and postpartum back pain.  Patient expressed understanding and wished to proceed.  All questions were answered.  Sterile technique used throughout procedure.  CSF was clear.  No parasthesia or other complications.  Please see nursing notes for vital signs.

## 2011-01-16 NOTE — Consult Note (Signed)
Called to attend scheduled repeat C/S at term gestation with additional history of breech presentation by report.  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   At delivery infant in single footling breech presentation. Tight nuchal cord x 2 and single body cord, ligated following delivery of all but head in order to reduce; spontaneous cries and movement of all extremities. Given tactile stimulation with drying and bulb suction to naso/oropharynx. No dysmorphic features.  Apgar 9/10 at 1/5 minutes respectively.  Ortolani done after 5 minutes of age for benefit of father and found to be negative. Father reassured that Dr. Maple Hudson would monitor this during well child care.   Care transferred to Transitional Nursery RN and to assigned pediatrician.     Dagoberto Ligas MD [facility]

## 2011-01-17 LAB — CBC
Hemoglobin: 12.6 g/dL (ref 12.0–15.0)
MCH: 33.4 pg (ref 26.0–34.0)
MCHC: 34.5 g/dL (ref 30.0–36.0)

## 2011-01-17 NOTE — Progress Notes (Signed)
Patient ID: ADALAYA IRION, female   DOB: 11/10/1975, 36 y.o.   MRN: [id number]  POSTOPERATIVE DAY # 1 S/P cesarean section - repeat   S:         Reports feeling well - mainly just soreness             Tolerating po intake / no nausea / no vomiting / + flatus / no BM             Bleeding is light             Pain controlled withnarcotic meds yesterday - motrin during the night             Up ad lib / ambulatory  Newborn breast feeding  / Circumcision planned prior to discharge   O:  A & O x 3 NAD             VS: Blood pressure 102/61, pulse 58, temperature 98.3 F (36.8 C), temperature source Oral, resp. rate 18, last menstrual period 04/16/2010, SpO2 96.00%, unknown if currently breastfeeding.  LABS: WBC/Hgb/Hct/Plts:  9.0/12.6/36.5/154 (01/13 0531)   I&O:   - 4700 net balance  Lungs: Clear and unlabored  Heart: regular rate and rhythm / no mumurs  Abdomen: soft, non-tender, non-distended, + bowel sounds             Fundus: firm, non-tender, Ueven             Dressing NONE              Incision:  approximated with subcuticular / no erythema / no ecchymosis / no drainage  Perineum: no edema  Lochia: light  Extremities: no edema, no calf pain or tenderness  A:        POD # 1 S/P cesarean section             P:        Routine postoperative care              Considering early discharge late tomorrow afternoon (after 3pm)             Circ in AM     [facility] 01/17/2011, 7:11 AM

## 2011-01-18 ENCOUNTER — Encounter (HOSPITAL_COMMUNITY): Payer: Self-pay | Admitting: *Deleted

## 2011-01-18 MED ORDER — IBUPROFEN 600 MG PO TABS: 600.0000 mg | ORAL_TABLET | Freq: Four times a day (QID) | ORAL | Status: AC

## 2011-01-18 MED ORDER — OXYCODONE-ACETAMINOPHEN 5-325 MG PO TABS: 1.0000 | ORAL_TABLET | ORAL | Status: AC | PRN

## 2011-01-18 NOTE — Discharge Summary (Signed)
Reviewed, agree 

## 2011-01-18 NOTE — Discharge Summary (Signed)
POSTOPERATIVE DISCHARGE SUMMARY:  Patient ID: [name] MRN: [id number] DOB/AGE: Dec 31, 1975 36 y.o.  Admit date: 01/16/2011 Discharge date:  01/18/2011  Admission Diagnoses: 39 weeks previous cesarean section -elective repeat    Discharge Diagnoses:   Term Pregnancy-delivered  POD #2 s/p cesarean section  Prenatal history: [id number]   EDC : 01/21/2011, by Last Menstrual Period  Prenatal care at [facility]  Primary provider : Dr Juliene Pina Prenatal course complicated by previous cesarean section   Prenatal Labs: ABO, Rh: O (06/13 0000) Positive Antibody: Negative (06/13 0000) Rubella: Immune (06/13 0000)  RPR: NON REACTIVE (01/09 1625)  HBsAg: Negative (06/13 0000)  HIV: Non-reactive (06/13 0000)  GBS: Positive (12/18 0000)  1 hr Glucola : nl   Medical / Surgical History :  Past medical history:  Past Medical History  Diagnosis Date  . Seizures     childhood febrile seizure  . GERD (gastroesophageal reflux disease)     preg related  . Complication of anesthesia     was drowsy during delivery after epidural    Past surgical history:  Past Surgical History  Procedure Date  . Cesarean section 2006    Family History: No family history on file.  Social History:  reports that she has never smoked. She does not have any smokeless tobacco history on file. She reports that she does not drink alcohol or use illicit drugs.   Allergies: Review of patient's allergies indicates no known allergies.    Current Medications at time of admission:  Prior to Admission medications   Medication Sig Start Date End Date Taking? Authorizing Provider  loratadine (CLARITIN) 10 MG tablet Take 10 mg by mouth daily as needed. For head cold    Yes Historical Provider, MD  Prenatal Vit-Fe Fumarate-FA (PRENATAL MULTIVITAMIN) TABS Take 1 tablet by mouth daily.     Yes Historical Provider, MD  ibuprofen (ADVIL,MOTRIN) 600 MG tablet Take 1 tablet (600 mg total) by mouth every  6 (six) hours. 01/18/11 01/28/11  Marlinda Mike  oxyCODONE-acetaminophen (PERCOCET) 5-325 MG per tablet Take 1 tablet by mouth every 4 (four) hours as needed (moderate - severe pain). 01/18/11 01/28/11  Marlinda Mike     Intrapartum Course: none  Procedures: Cesarean section delivery of female newborn by Dr Juliene Pina  See operative report for further details  Postoperative / postpartum course: uneventful  Physical Exam:   VSS: Blood pressure 103/71, pulse 80, temperature 97.7 F (36.5 C), temperature source Oral, resp. rate 18, last menstrual period 04/16/2010, SpO2 97.00%, unknown if currently breastfeeding.  LABS:   preop hgb 14.1 / postop hgb 12.6   PE - normal limits Incision:  approximated with subcuticular / no erythema / no  ecchymosis / no drainage  Discharge Instructions:  Discharged Condition: stable Activity: pelvic rest and postoperative restrictions x 2 weeks Diet: routine Medications: PNV, Ibuprofen, Colace and Percocet Current Discharge Medication List    START taking these medications   Details  ibuprofen (ADVIL,MOTRIN) 600 MG tablet Take 1 tablet (600 mg total) by mouth every 6 (six) hours. Qty: 30 tablet, Refills: 0    oxyCODONE-acetaminophen (PERCOCET) 5-325 MG per tablet Take 1 tablet by mouth every 4 (four) hours as needed (moderate - severe pain). Qty: 30 tablet, Refills: 0      CONTINUE these medications which have NOT CHANGED   Details  loratadine (CLARITIN) 10 MG tablet Take 10 mg by mouth daily as needed. For head cold     Prenatal Vit-Fe Fumarate-FA (PRENATAL MULTIVITAMIN)  TABS Take 1 tablet by mouth daily.        STOP taking these medications     guaiFENesin (MUCINEX) 600 MG 12 hr tablet      calcium carbonate (TUMS - DOSED IN MG ELEMENTAL CALCIUM) 500 MG chewable tablet      diphenhydrAMINE (BENADRYL) 25 MG tablet        Condition: stable Postpartum Instructions: refer to practice specific booklet Discharge to: home Disposition:  Follow up :   Follow-up Information    Follow up with MODY,VAISHALI R, MD. Schedule an appointment as soon as possible for a visit in 6 weeks.   Contact information:   [address] [place] [address] [phone]           Signed: Marlinda Mike 01/18/2011, 9:19 AM

## 2011-01-18 NOTE — Progress Notes (Signed)
Patient ID: [name], female   DOB: 11/16/1975, 36 y.o.   MRN: [id number]  POSTOPERATIVE DAY # 2 S/P cesarean section   S:         Reports feeling well - desires d/c home today             Tolerating po intake / no nausea / no vomiting / + flatus / + BM             Bleeding is light             Pain controlled with motrin and percocet             Up ad lib / ambulatory  Newborn breast feeding  / Circumcision done   O:  A & O x 3 NAD             VS: Blood pressure 103/71, pulse 80, temperature 97.7 F (36.5 C), temperature source Oral, resp. rate 18, last menstrual period 04/16/2010, SpO2 97.00%, unknown if currently breastfeeding.  Lungs: Clear and unlabored  Heart: regular rate and rhythm / no mumurs  Abdomen: soft, non-tender, non-distended, active bowel sounds             Fundus: firm, non-tender, U-1             Dressing OFF              Incision:  approximated with subcuticular / no erythema / no ecchymosis / no drainage  Perineum: no edema  Lochia: light  Extremities: no edema, no calf pain or tenderness  A:        POD # 2 S/P cesarean section            Stable for discharge   P:        Routine postoperative care              Discharge to home   [facility] 01/18/2011, 9:13 AM

## 2013-01-15 ENCOUNTER — Other Ambulatory Visit: Payer: Self-pay | Admitting: Dermatology

## 2013-03-28 ENCOUNTER — Other Ambulatory Visit: Payer: Self-pay | Admitting: Dermatology

## 2013-11-05 ENCOUNTER — Encounter (HOSPITAL_COMMUNITY): Payer: Self-pay | Admitting: *Deleted

## 2013-12-20 ENCOUNTER — Other Ambulatory Visit: Payer: Self-pay | Admitting: Dermatology

## 2019-03-01 ENCOUNTER — Ambulatory Visit: Payer: BC Managed Care – PPO | Attending: Internal Medicine

## 2019-03-01 ENCOUNTER — Ambulatory Visit: Payer: BC Managed Care – PPO

## 2019-03-01 DIAGNOSIS — Z23 Encounter for immunization: Secondary | ICD-10-CM | POA: Insufficient documentation

## 2019-03-01 NOTE — Progress Notes (Signed)
   Covid-19 Vaccination Clinic  Name:  [name]    MRN: [id number] DOB: 1975/08/10  03/01/2019  [name] was observed post Covid-19 immunization for 15 minutes without incidence. She was provided with Vaccine Information Sheet and instruction to access the V-Safe system.   [name] was instructed to call 911 with any severe reactions post vaccine: Marland Kitchen Difficulty breathing  . Swelling of your face and throat  . A fast heartbeat  . A bad rash all over your body  . Dizziness and weakness    Immunizations Administered    Name Date Dose VIS Date Route   Pfizer COVID-19 Vaccine 03/01/2019  3:28 PM 0.3 mL 12/15/2018 Intramuscular   Manufacturer: [employer], [employer]   Lot: [id number]   NDC: [address]-9

## 2019-03-28 ENCOUNTER — Ambulatory Visit: Payer: BC Managed Care – PPO | Attending: Internal Medicine

## 2019-03-28 DIAGNOSIS — Z23 Encounter for immunization: Secondary | ICD-10-CM

## 2019-03-28 NOTE — Progress Notes (Signed)
   Covid-19 Vaccination Clinic  Name:  BETSI CRESPI    MRN: [id number] DOB: 21-Aug-1975  03/28/2019  Ms. Derocher was observed post Covid-19 immunization for 15 minutes without incident. She was provided with Vaccine Information Sheet and instruction to access the V-Safe system.   Ms. Folsom was instructed to call 911 with any severe reactions post vaccine: Marland Kitchen Difficulty breathing  . Swelling of face and throat  . A fast heartbeat  . A bad rash all over body  . Dizziness and weakness   Immunizations Administered    Name Date Dose VIS Date Route   Pfizer COVID-19 Vaccine 03/28/2019  4:01 PM 0.3 mL 12/15/2018 Intramuscular   Manufacturer: [employer], [employer]   Lot: [id number]   NDC: [address]-3

## 2020-08-13 ENCOUNTER — Other Ambulatory Visit: Payer: Self-pay | Admitting: Obstetrics & Gynecology

## 2020-08-13 DIAGNOSIS — R928 Other abnormal and inconclusive findings on diagnostic imaging of breast: Secondary | ICD-10-CM

## 2020-09-12 ENCOUNTER — Other Ambulatory Visit: Payer: Self-pay

## 2020-09-12 ENCOUNTER — Other Ambulatory Visit: Payer: Self-pay | Admitting: Obstetrics & Gynecology

## 2020-09-12 ENCOUNTER — Ambulatory Visit
Admission: RE | Admit: 2020-09-12 | Discharge: 2020-09-12 | Disposition: A | Discharge status: Home / Self Care | Payer: BC Managed Care – PPO | Source: Ambulatory Visit | Attending: Obstetrics & Gynecology | Admitting: Obstetrics & Gynecology

## 2020-09-12 DIAGNOSIS — R928 Other abnormal and inconclusive findings on diagnostic imaging of breast: Secondary | ICD-10-CM

## 2020-09-12 DIAGNOSIS — R921 Mammographic calcification found on diagnostic imaging of breast: Secondary | ICD-10-CM

## 2021-03-13 ENCOUNTER — Other Ambulatory Visit: Payer: Self-pay | Admitting: Obstetrics & Gynecology

## 2021-03-13 ENCOUNTER — Encounter: Payer: Self-pay | Admitting: Radiology

## 2021-03-13 ENCOUNTER — Ambulatory Visit
Admission: RE | Admit: 2021-03-13 | Discharge: 2021-03-13 | Disposition: A | Discharge status: Home / Self Care | Payer: BC Managed Care – PPO | Source: Ambulatory Visit | Attending: Obstetrics & Gynecology | Admitting: Obstetrics & Gynecology

## 2021-03-13 DIAGNOSIS — R921 Mammographic calcification found on diagnostic imaging of breast: Secondary | ICD-10-CM

## 2021-09-02 ENCOUNTER — Ambulatory Visit
Admission: RE | Admit: 2021-09-02 | Discharge: 2021-09-02 | Disposition: A | Discharge status: Home / Self Care | Payer: BC Managed Care – PPO | Source: Ambulatory Visit | Attending: Obstetrics & Gynecology | Admitting: Obstetrics & Gynecology

## 2021-09-02 DIAGNOSIS — R921 Mammographic calcification found on diagnostic imaging of breast: Secondary | ICD-10-CM

## 2022-08-05 ENCOUNTER — Other Ambulatory Visit: Payer: Self-pay | Admitting: Obstetrics & Gynecology

## 2022-08-05 DIAGNOSIS — R921 Mammographic calcification found on diagnostic imaging of breast: Secondary | ICD-10-CM

## 2022-08-05 DIAGNOSIS — Z1231 Encounter for screening mammogram for malignant neoplasm of breast: Secondary | ICD-10-CM

## 2022-08-06 ENCOUNTER — Encounter: Payer: Self-pay | Admitting: Obstetrics & Gynecology

## 2022-09-09 ENCOUNTER — Ambulatory Visit
Admission: RE | Admit: 2022-09-09 | Discharge: 2022-09-09 | Disposition: A | Discharge status: Home / Self Care | Payer: BC Managed Care – PPO | Source: Ambulatory Visit | Attending: Obstetrics & Gynecology | Admitting: Obstetrics & Gynecology

## 2022-09-09 DIAGNOSIS — R921 Mammographic calcification found on diagnostic imaging of breast: Secondary | ICD-10-CM

## 2023-03-25 IMAGING — MG MM DIGITAL DIAGNOSTIC UNILAT*R* W/ TOMO W/ CAD
6 series · 6 of 14 positions shown · non-contrast
Comparison: Previous exam(s).

CLINICAL DATA: Follow-up right breast probably benign
calcifications.

EXAM:
DIGITAL DIAGNOSTIC UNILATERAL RIGHT MAMMOGRAM WITH TOMOSYNTHESIS AND
CAD
TECHNIQUE: Right digital diagnostic mammography and breast tomosynthesis was
performed. The images were evaluated with computer-aided detection.

[R ML]
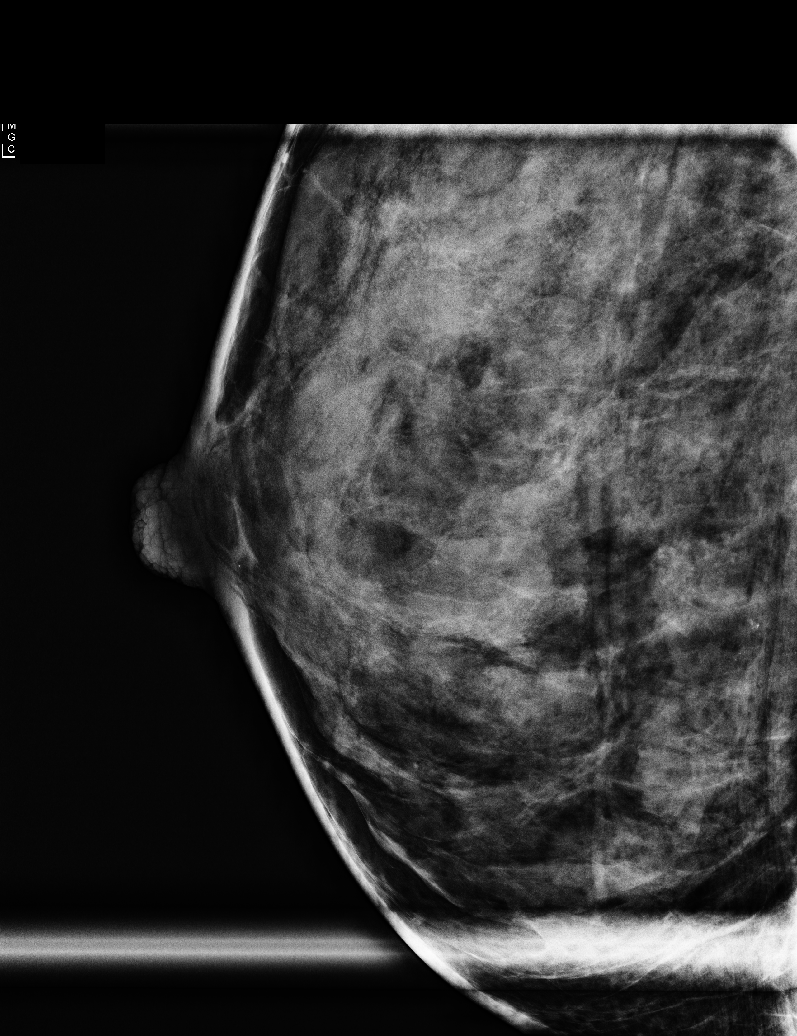

[R CC]
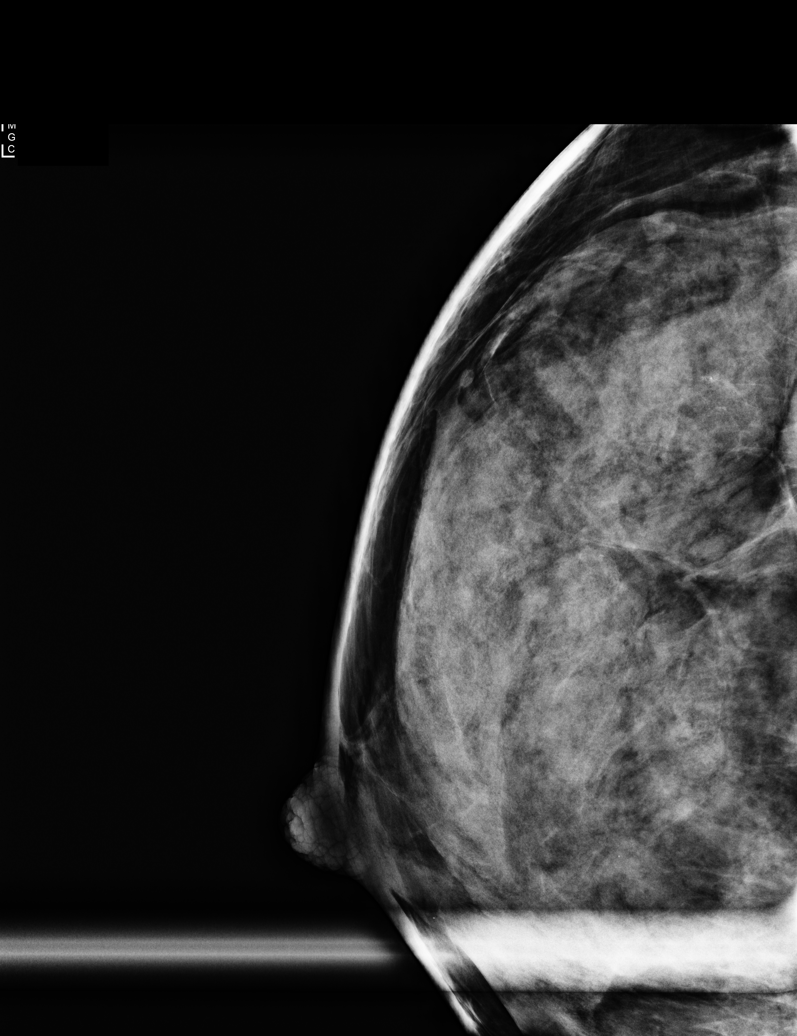

[R CC synth-2D]
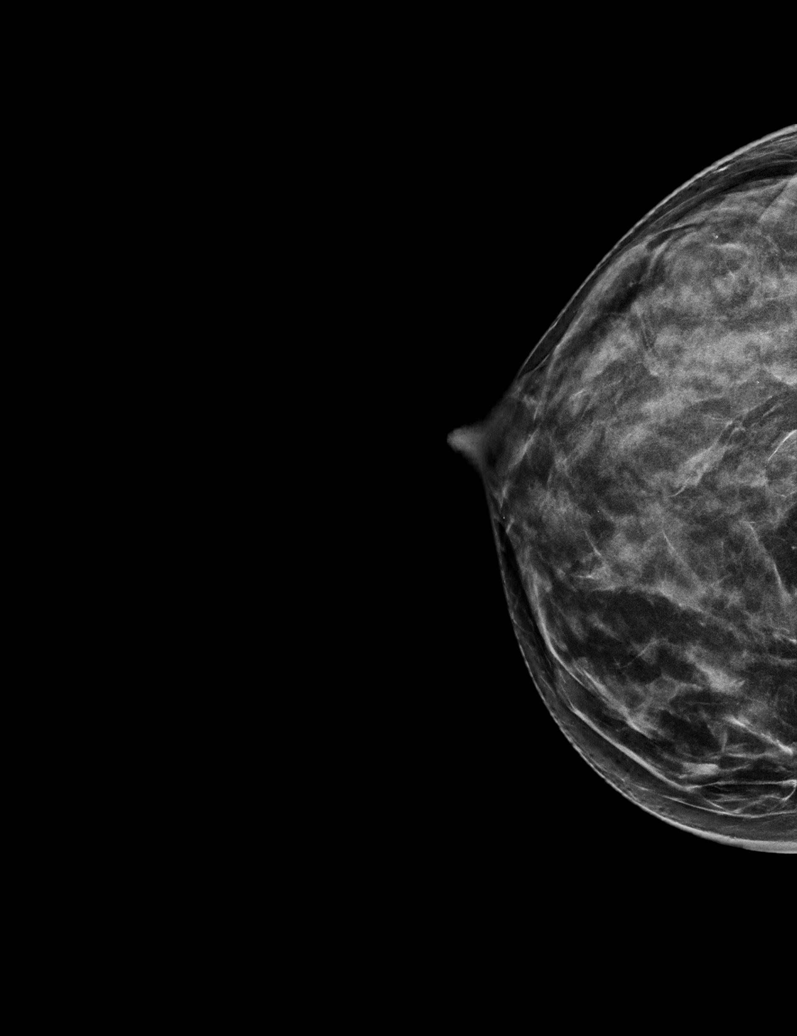

[R MLO synth-2D]
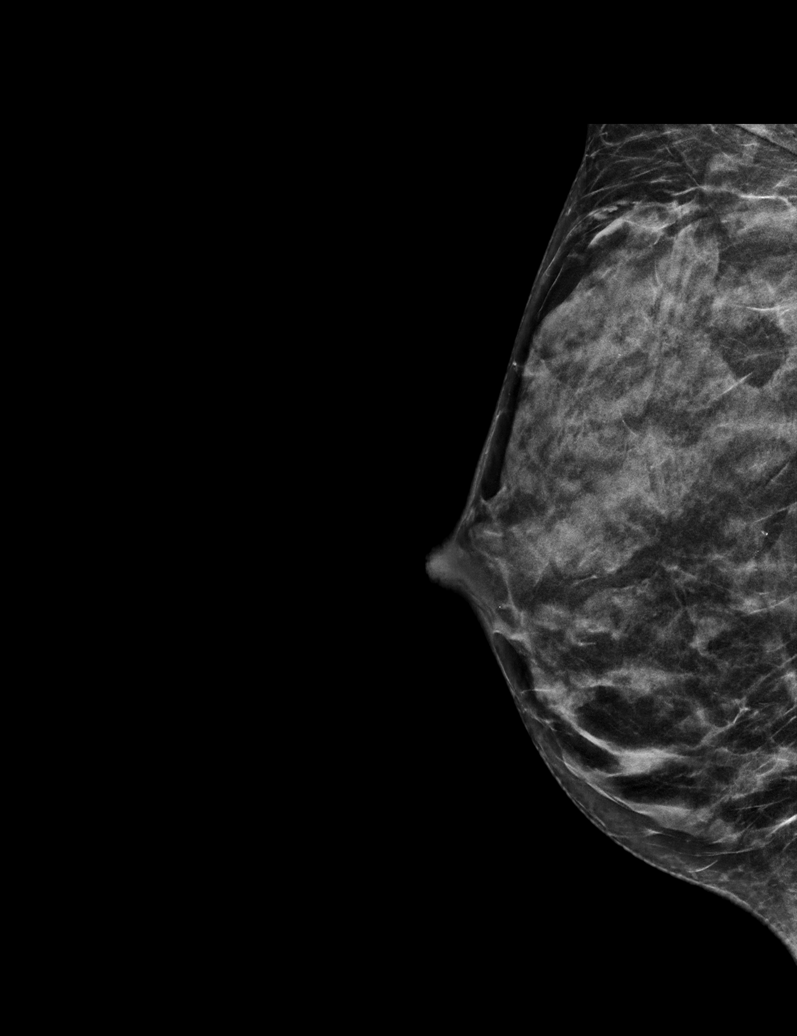

[R MLO tomo · tomo slice 27/52.0]
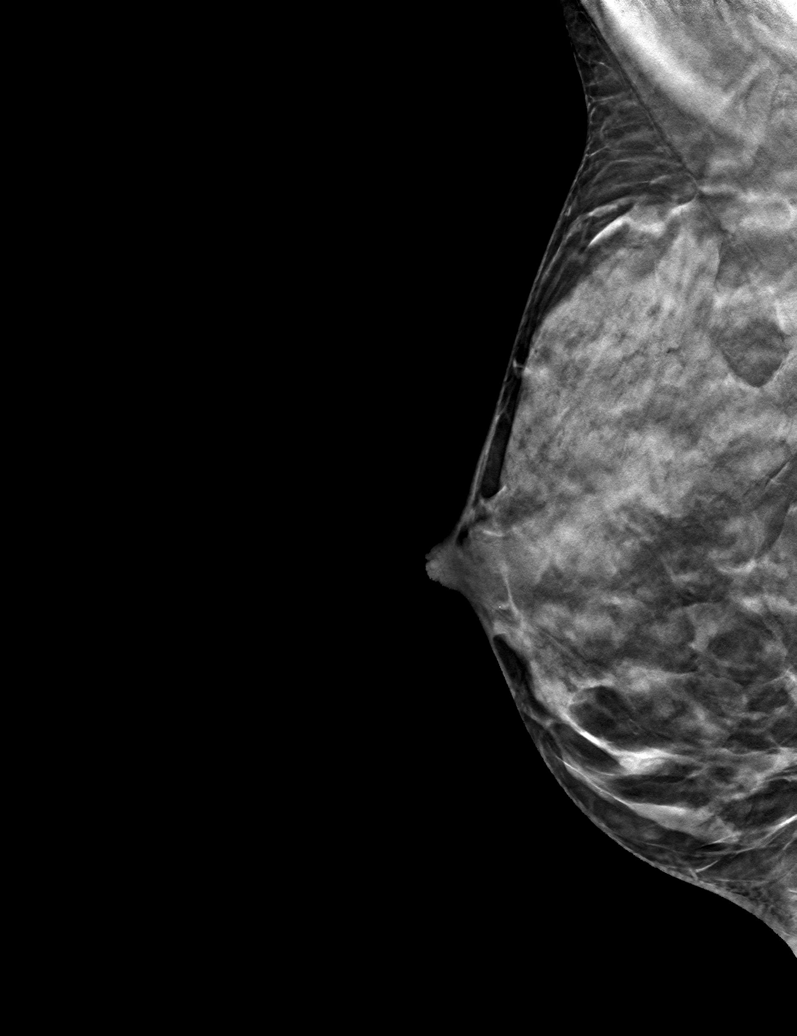

[R CC tomo · tomo slice 28/55.0]
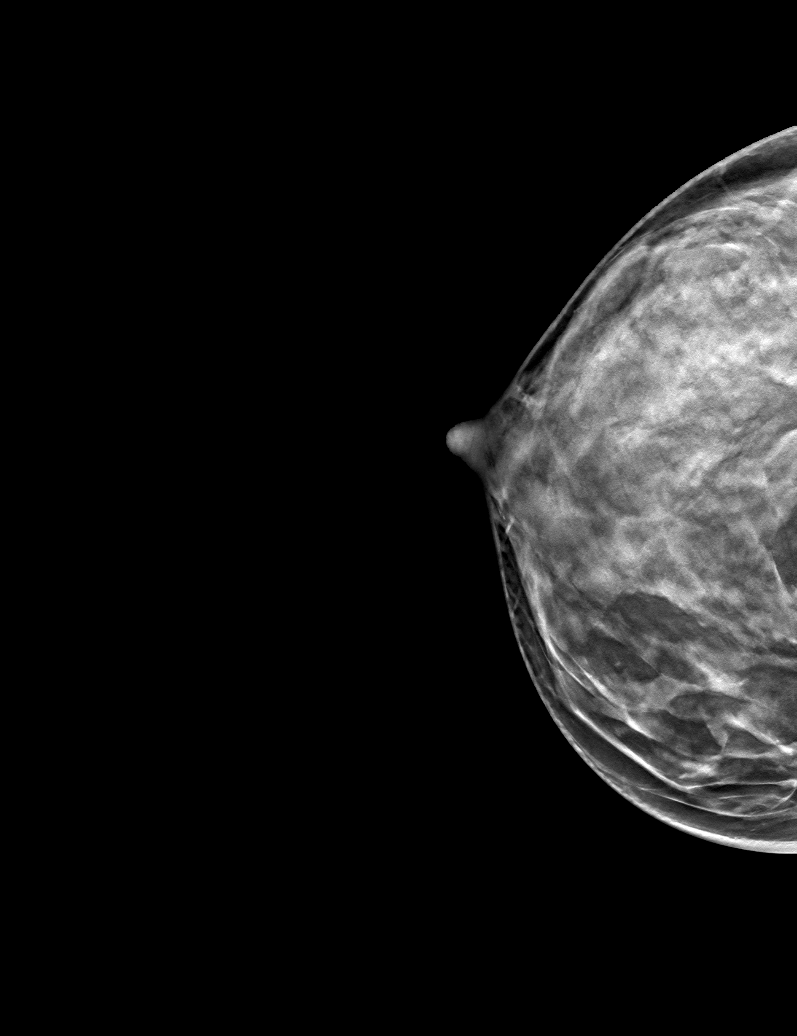

[6 of 14 positions shown; findings below may reference images not displayed]

ACR Breast Density Category d: The breast tissue is extremely dense,
which lowers the sensitivity of mammography.
FINDINGS: The previously described right breast probably benign calcifications
have a slightly more benign appearance today, with some dependent
layering demonstrated. No interval findings suspicious for
malignancy in the right breast.
IMPRESSION: Slightly improved appearance of the previously described probably
benign right calcifications, with some milk of calcium demonstrated
today.

RECOMMENDATION:
Bilateral diagnostic mammogram with magnification views on the right
in 5 months. That will be 1 year since mammographic evaluation of
the left breast.

I have discussed the findings and recommendations with the patient.
If applicable, a reminder letter will be sent to the patient
regarding the next appointment.

BI-RADS CATEGORY  3: Probably benign.

## 2023-04-21 ENCOUNTER — Other Ambulatory Visit: Payer: Self-pay | Admitting: Nurse Practitioner

## 2023-04-21 DIAGNOSIS — Z1231 Encounter for screening mammogram for malignant neoplasm of breast: Secondary | ICD-10-CM

## 2023-09-12 ENCOUNTER — Ambulatory Visit
Admission: RE | Admit: 2023-09-12 | Discharge: 2023-09-12 | Disposition: A | Discharge status: Home / Self Care | Payer: Self-pay | Source: Ambulatory Visit | Attending: Nurse Practitioner | Admitting: Nurse Practitioner

## 2023-09-12 DIAGNOSIS — Z1231 Encounter for screening mammogram for malignant neoplasm of breast: Secondary | ICD-10-CM
